# Patient Record
Sex: Female | Born: 1995 | Race: Black or African American | Hispanic: No | Marital: Single | Smoking: Never smoker
Health system: Southern US, Community
[De-identification: ages and names within clinical notes are randomized; demographics above are authoritative.]

---

## 2017-10-05 ENCOUNTER — Encounter (HOSPITAL_COMMUNITY): Payer: Self-pay | Admitting: Emergency Medicine

## 2017-10-05 ENCOUNTER — Other Ambulatory Visit: Payer: Self-pay

## 2017-10-05 ENCOUNTER — Emergency Department (HOSPITAL_COMMUNITY)
Admission: EM | Admit: 2017-10-05 | Discharge: 2017-10-06 | Disposition: A | Discharge status: Home / Self Care | Payer: BLUE CROSS/BLUE SHIELD | Attending: Emergency Medicine | Admitting: Emergency Medicine

## 2017-10-05 DIAGNOSIS — R51 Headache: Secondary | ICD-10-CM | POA: Diagnosis present

## 2017-10-05 DIAGNOSIS — J0110 Acute frontal sinusitis, unspecified: Secondary | ICD-10-CM | POA: Insufficient documentation

## 2017-10-05 DIAGNOSIS — R519 Headache, unspecified: Secondary | ICD-10-CM

## 2017-10-05 MED ORDER — KETOROLAC TROMETHAMINE 30 MG/ML IJ SOLN
30.0000 mg | Freq: Once | INTRAMUSCULAR | Status: AC
  Administered 2017-10-05: 30 mg via INTRAVENOUS
  Filled 2017-10-05: qty 1

## 2017-10-05 MED ORDER — METOCLOPRAMIDE HCL 5 MG/ML IJ SOLN
10.0000 mg | Freq: Once | INTRAMUSCULAR | Status: AC
Start: 2017-10-05 — End: 2017-10-05
  Administered 2017-10-05: 10 mg via INTRAVENOUS
  Filled 2017-10-05: qty 2

## 2017-10-05 MED ORDER — SODIUM CHLORIDE 0.9 % IV BOLUS
1000.0000 mL | Freq: Once | INTRAVENOUS | Status: AC
  Administered 2017-10-05: 1000 mL via INTRAVENOUS

## 2017-10-05 MED ORDER — DIPHENHYDRAMINE HCL 50 MG/ML IJ SOLN
25.0000 mg | Freq: Once | INTRAMUSCULAR | Status: AC
  Administered 2017-10-05: 25 mg via INTRAVENOUS
  Filled 2017-10-05: qty 1

## 2017-10-05 NOTE — ED Provider Notes (Signed)
[facility]-EMERGENCY DEPT Provider Note   CSN: [id number] Arrival date & time: 10/05/17  2139     History   Chief Complaint Chief Complaint  Patient presents with  . Headache    HPI Amy Bright is a 22 y.o. female.  HPI   22 year old female presenting for evaluation of headache.  Patient report for the past 2 weeks she has had cold symptoms with history includes throbbing frontal headache, sinus congestion, sneezing, coughing, throat irritation, and chest congestion.  Her symptom has been persistent not improved despite using over-the-counter medication such as Sudafed and nasal spray.  She tries ibuprofen on occasion with minimal improvement.  Headache is becoming progressively worse, she left work today to come here for further management.  Headache is throbbing, achy, moderate in severity with light and sensitivity.  Denies fever, chills, neck stiffness, focal numbness or weakness, or rash.   History reviewed. No pertinent past medical history.  There are no active problems to display for this patient.   History reviewed. No pertinent surgical history.   OB History   None      Home Medications    Prior to Admission medications   Not on File    Family History No family history on file.  Social History Social History   Tobacco Use  . Smoking status: Not on file  Substance Use Topics  . Alcohol use: Not Currently    Frequency: Never  . Drug use: Not Currently     Allergies   Patient has no known allergies.   Review of Systems Review of Systems  All other systems reviewed and are negative.    Physical Exam Updated Vital Signs BP (!) 145/85 (BP Location: Right Arm)   Pulse (!) 101   Temp 98.4 F (36.9 C) (Oral)   Ht 5' 5.5" (1.664 m)   Wt (!) 138.3 kg (305 lb)   SpO2 100%   BMI 49.98 kg/m   Physical Exam  Constitutional: She is oriented to person, place, and time. She appears well-developed and well-nourished. No  distress.  Obese female nontoxic in appearance  HENT:  Head: Atraumatic.  Mouth/Throat: Oropharynx is clear and moist.  Ears: TMs normal bilaterally Nose: Normal nares Throat: Uvula midline no tonsillar enlargement or exudate Tenderness to percussion's of frontal and ethmoids sinuses  Eyes: Pupils are equal, round, and reactive to light. Conjunctivae and EOM are normal.  Neck: Normal range of motion. Neck supple.  No nuchal rigidity  Cardiovascular: Normal rate and regular rhythm.  Pulmonary/Chest: Effort normal and breath sounds normal.  Abdominal: Soft. Bowel sounds are normal. There is no tenderness.  Musculoskeletal: Normal range of motion.  Neurological: She is alert and oriented to person, place, and time. She has normal strength. GCS eye subscore is 4. GCS verbal subscore is 5. GCS motor subscore is 6.  Skin: Skin is warm. No rash noted.  Psychiatric: She has a normal mood and affect.  Nursing note and vitals reviewed.    ED Treatments / Results  Labs (all labs ordered are listed, but only abnormal results are displayed) Labs Reviewed  I-STAT BETA HCG BLOOD, ED ([facility], [facility], [facility] ONLY)    EKG None  Radiology No results found.  Procedures Procedures (including critical care time)  Medications Ordered in ED Medications  sodium chloride 0.9 % bolus 1,000 mL (0 mLs Intravenous Stopped 10/06/17 0225)  diphenhydrAMINE (BENADRYL) injection 25 mg (25 mg Intravenous Given 10/05/17 2352)  metoCLOPramide (REGLAN) injection 10 mg (10 mg  Intravenous Given 10/05/17 2352)  ketorolac (TORADOL) 30 MG/ML injection 30 mg (30 mg Intravenous Given 10/05/17 2352)     Initial Impression / Assessment and Plan / ED Course  I have reviewed the triage vital signs and the nursing notes.  Pertinent labs & imaging results that were available during my care of the patient were reviewed by me and considered in my medical decision making (see chart for details).     BP (!) 107/53 (BP Location: Right  Arm)   Pulse 82   Temp 98.4 F (36.9 C) (Oral)   Resp 15   Ht 5' 5.5" (1.664 m)   Wt (!) 138.3 kg (305 lb)   SpO2 100%   BMI 49.98 kg/m    Final Clinical Impressions(s) / ED Diagnoses   Final diagnoses:  Sinus headache  Subacute frontal sinusitis    ED Discharge Orders        Ordered    amoxicillin-clavulanate (AUGMENTIN) 875-125 MG tablet  2 times daily     10/06/17 0234     11:32 PM Patient complaining of sinus headache along with cold symptoms.  She is well-appearing no nuchal rigidity no evidence to suggest meningitis.  Headache has been an ongoing issue for approximately 2 weeks.  Will give migraine cocktail.  Anticipate discharge with Augmentin as treatment for suspect sinusitis due to the prolonged course of her symptoms.  2:30 AM Patient resting comfortably after receiving migraine cocktail.  She is stable for discharge.  Patient discharged home with Augmentin for treatment of suspected sinusitis.   Fayrene Helper, PA-C 10/06/17 0235    Nicanor Alcon, April, MD 10/06/17 [phone]

## 2017-10-05 NOTE — ED Triage Notes (Signed)
Pt from home with c/o sinus pressure and pain that began when she had a cold on Saturday. Pt states pain is in frontal lobe and around eyes and sinus area. Pt is afebrile. Pt denies changes in vision and no neuro deficits are noted.

## 2017-10-06 ENCOUNTER — Other Ambulatory Visit: Payer: Self-pay

## 2017-10-06 LAB — I-STAT BETA HCG BLOOD, ED (MC, WL, AP ONLY)

## 2017-10-06 MED ORDER — AMOXICILLIN-POT CLAVULANATE 875-125 MG PO TABS: 1.0000 | ORAL_TABLET | Freq: Two times a day (BID) | ORAL | 0 refills | Status: DC

## 2018-06-12 ENCOUNTER — Ambulatory Visit (HOSPITAL_COMMUNITY)
Admission: EM | Admit: 2018-06-12 | Discharge: 2018-06-12 | Disposition: A | Discharge status: Home / Self Care | Payer: BLUE CROSS/BLUE SHIELD | Attending: Internal Medicine | Admitting: Internal Medicine

## 2018-06-12 ENCOUNTER — Other Ambulatory Visit: Payer: Self-pay

## 2018-06-12 ENCOUNTER — Encounter (HOSPITAL_COMMUNITY): Payer: Self-pay | Admitting: Emergency Medicine

## 2018-06-12 DIAGNOSIS — E079 Disorder of thyroid, unspecified: Secondary | ICD-10-CM | POA: Diagnosis present

## 2018-06-12 LAB — T4, FREE: Free T4: 1.01 ng/dL (ref 0.82–1.77)

## 2018-06-12 LAB — TSH: TSH: 1.4 u[IU]/mL (ref 0.350–4.500)

## 2018-06-12 MED ORDER — FLUTICASONE PROPIONATE 50 MCG/ACT NA SUSP: 1.0000 | Freq: Every day | NASAL | 0 refills | Status: AC

## 2018-06-12 NOTE — Discharge Instructions (Addendum)
Patient is scheduled for thyroid ultrasound tomorrow at [facility] radiology department at 4:30 PM.

## 2018-06-12 NOTE — ED Provider Notes (Signed)
[facility]    CSN: [id number] Arrival date & time: 06/12/18  1612     History   Chief Complaint Chief Complaint  Patient presents with  . Sore Throat    HPI Amy Bright is a 23 y.o. female comes to the urgent care department for pain on swallowing.   Patient was apparently well until about 2 weeks ago when she had some nasal congestion and sore throat.  She treated her symptomatically and that improved.  She comes in today with a few day history of difficulty swallowing.  Difficulty swallowing is for both solids and liquids.  No fever or chills.  No trauma to the neck.  No shortness of breath or wheezing.  Patient complains of previous history of thyroid enlargement with tenderness.   History reviewed. No pertinent past medical history.  There are no active problems to display for this patient.   History reviewed. No pertinent surgical history.  OB History   No obstetric history on file.      Home Medications    Prior to Admission medications   Medication Sig Start Date End Date Taking? Authorizing Provider  amoxicillin-clavulanate (AUGMENTIN) 875-125 MG tablet Take 1 tablet by mouth 2 (two) times daily. One po bid x 7 days 10/06/17   Fayrene Helper, PA-C    Family History Family History  Problem Relation Age of Onset  . Hypertension Mother     Social History Social History   Tobacco Use  . Smoking status: Never Smoker  Substance Use Topics  . Alcohol use: Yes    Frequency: Never  . Drug use: Not Currently    Types: Marijuana     Allergies   Patient has no known allergies.   Review of Systems Review of Systems  Constitutional: Negative for activity change, appetite change, chills, fatigue and fever.  HENT: Positive for postnasal drip. Negative for drooling, ear discharge, ear pain, facial swelling, rhinorrhea, sinus pressure, sinus pain and sore throat.   Eyes: Negative for redness.  Respiratory: Negative for cough, chest tightness and  shortness of breath.   Gastrointestinal: Negative for abdominal distention, abdominal pain, nausea and vomiting.  Endocrine: Negative for heat intolerance, polydipsia, polyphagia and polyuria.  Genitourinary: Negative for flank pain and menstrual problem.  Musculoskeletal: Negative for arthralgias and myalgias.  Skin: Negative for rash and wound.  Neurological: Negative for dizziness, weakness and numbness.  Psychiatric/Behavioral: Negative for confusion and decreased concentration.     Physical Exam Triage Vital Signs ED Triage Vitals  Enc Vitals Group     BP 06/12/18 1705 (!) 118/50     Pulse Rate 06/12/18 1705 61     Resp 06/12/18 1705 18     Temp 06/12/18 1705 98.6 F (37 C)     Temp Source 06/12/18 1705 Temporal     SpO2 06/12/18 1705 100 %     Weight --      Height --      Head Circumference --      Peak Flow --      Pain Score 06/12/18 1701 4     Pain Loc --      Pain Edu? --      Excl. in GC? --    No data found.  Updated Vital Signs BP (!) 118/50 (BP Location: Left Arm) Comment (BP Location): large cuff  Pulse 61   Temp 98.6 F (37 C) (Temporal)   Resp 18   SpO2 100%   Visual Acuity Right Eye Distance:  Left Eye Distance:   Bilateral Distance:    Right Eye Near:   Left Eye Near:    Bilateral Near:     Physical Exam Vitals signs and nursing note reviewed.  Constitutional:      General: She is not in acute distress.    Appearance: She is well-developed. She is not ill-appearing.  HENT:     Right Ear: Tympanic membrane normal.     Left Ear: Tympanic membrane normal.     Nose: No congestion or rhinorrhea.     Mouth/Throat:     Mouth: Mucous membranes are moist. No oral lesions.     Pharynx: No oropharyngeal exudate.     Tonsils: Swelling: 1+ on the right. 1+ on the left.  Eyes:     Pupils: Pupils are equal, round, and reactive to light.  Neck:     Thyroid: Thyromegaly present.     Comments: Thyromegaly with tenderness on  palpation Cardiovascular:     Rate and Rhythm: Normal rate and regular rhythm.     Heart sounds: Normal heart sounds.  Pulmonary:     Effort: Pulmonary effort is normal.     Breath sounds: Normal breath sounds.  Abdominal:     General: Bowel sounds are normal.     Palpations: Abdomen is soft.  Lymphadenopathy:     Cervical: No cervical adenopathy.  Skin:    Capillary Refill: Capillary refill takes less than 2 seconds.  Neurological:     General: No focal deficit present.     Mental Status: She is alert.      UC Treatments / Results  Labs (all labs ordered are listed, but only abnormal results are displayed) Labs Reviewed  TSH  T4, FREE    EKG None  Radiology No results found.  Procedures Procedures (including critical care time)  Medications Ordered in UC Medications - No data to display  Initial Impression / Assessment and Plan / UC Course  I have reviewed the triage vital signs and the nursing notes.  Pertinent labs & imaging results that were available during my care of the patient were reviewed by me and considered in my medical decision making (see chart for details).     1.  Thyromegaly with tenderness (thyroiditis possibly viral): Ultrasound of the thyroid gland Free T4 TSH If labs are abnormal patient may need further work-up for Hashimoto's disease  Final Clinical Impressions(s) / UC Diagnoses   Final diagnoses:  Swelling of thyroid gland   Discharge Instructions   None    ED Prescriptions    None     Controlled Substance Prescriptions [place] Controlled Substance Registry consulted? No   Merrilee Jansky, MD 06/12/18 [phone]

## 2018-06-12 NOTE — ED Triage Notes (Signed)
Sore throat started 2 weeks ago.  Patient has congestion in throat.

## 2018-06-13 ENCOUNTER — Ambulatory Visit (HOSPITAL_COMMUNITY): Admission: RE | Admit: 2018-06-13 | Payer: BLUE CROSS/BLUE SHIELD | Source: Ambulatory Visit

## 2018-06-13 ENCOUNTER — Ambulatory Visit (HOSPITAL_COMMUNITY)
Admit: 2018-06-13 | Discharge: 2018-06-13 | Disposition: A | Discharge status: Home / Self Care | Payer: BLUE CROSS/BLUE SHIELD | Attending: Internal Medicine | Admitting: Internal Medicine

## 2018-06-13 ENCOUNTER — Other Ambulatory Visit (HOSPITAL_COMMUNITY): Payer: Self-pay | Admitting: Internal Medicine

## 2018-06-13 ENCOUNTER — Ambulatory Visit (HOSPITAL_COMMUNITY)
Admission: RE | Admit: 2018-06-13 | Discharge: 2018-06-13 | Disposition: A | Discharge status: Home / Self Care | Payer: BLUE CROSS/BLUE SHIELD | Source: Ambulatory Visit | Attending: Internal Medicine | Admitting: Internal Medicine

## 2018-06-13 DIAGNOSIS — E049 Nontoxic goiter, unspecified: Secondary | ICD-10-CM | POA: Diagnosis not present

## 2018-06-15 ENCOUNTER — Telehealth (HOSPITAL_COMMUNITY): Payer: Self-pay | Admitting: Internal Medicine

## 2018-06-15 NOTE — Telephone Encounter (Signed)
I called the patient and discussed the results of TSH, Free T4 with the patient. US thyroid which was remarkable for multinodular goiter was discussed. Call was made to Dr.Stephen South's office and message was left with referral coordinator. Patient is also advised to call the Endocrinologist office for an appointment. Patient was furnished with Endocrinologist details and also phone number.

## 2019-02-26 ENCOUNTER — Ambulatory Visit (HOSPITAL_COMMUNITY)
Admission: EM | Admit: 2019-02-26 | Discharge: 2019-02-26 | Disposition: A | Discharge status: Home / Self Care | Payer: Self-pay | Attending: Family Medicine | Admitting: Family Medicine

## 2019-02-26 ENCOUNTER — Other Ambulatory Visit: Payer: Self-pay

## 2019-02-26 DIAGNOSIS — Z202 Contact with and (suspected) exposure to infections with a predominantly sexual mode of transmission: Secondary | ICD-10-CM | POA: Insufficient documentation

## 2019-02-26 DIAGNOSIS — Z3202 Encounter for pregnancy test, result negative: Secondary | ICD-10-CM

## 2019-02-26 DIAGNOSIS — Z113 Encounter for screening for infections with a predominantly sexual mode of transmission: Secondary | ICD-10-CM

## 2019-02-26 LAB — POC URINE PREG, ED: Preg Test, Ur: NEGATIVE

## 2019-02-26 LAB — POCT PREGNANCY, URINE: Preg Test, Ur: NEGATIVE

## 2019-02-26 MED ORDER — CEFTRIAXONE SODIUM 250 MG IJ SOLR
INTRAMUSCULAR | Status: AC
  Filled 2019-02-26: qty 250

## 2019-02-26 MED ORDER — AZITHROMYCIN 250 MG PO TABS
ORAL_TABLET | ORAL | Status: AC
  Filled 2019-02-26: qty 4

## 2019-02-26 MED ORDER — CEFTRIAXONE SODIUM 250 MG IJ SOLR
250.0000 mg | Freq: Once | INTRAMUSCULAR | Status: AC
  Administered 2019-02-26: 250 mg via INTRAMUSCULAR

## 2019-02-26 MED ORDER — AZITHROMYCIN 250 MG PO TABS
1000.0000 mg | ORAL_TABLET | Freq: Once | ORAL | Status: AC
  Administered 2019-02-26: 12:00:00 1000 mg via ORAL

## 2019-02-26 NOTE — ED Triage Notes (Addendum)
Pt presents to UC stating she would like to be tested for gonorrhea and chlamydia bc one of her sexual partners tested positive. Pt denies any symptoms at this time.   Pt states she has been "on her period for 4 weeks" and would like a pregnancy test.

## 2019-02-26 NOTE — Discharge Instructions (Signed)
We have treated you today for gonorrhea and chlamydia, with rocephin and azithromycin. Please refrain from sexual intercourse for 7 days while medicines eliminating infection.   We are testing you for Gonorrhea, Chlamydia, Trichomonas, Yeast and Bacterial Vaginosis. We will call you if anything is positive and let you know if you require any further treatment. Please inform partners of any positive results.   Please return if symptoms not improving with treatment, development of fever, nausea, vomiting, abdominal pain. ' 

## 2019-02-26 NOTE — ED Provider Notes (Signed)
[facility]    CSN: [id number] Arrival date & time: 02/26/19  1056      History   Chief Complaint Chief Complaint  Patient presents with  . Exposure to STD    HPI Amy Bright is a 23 y.o. female no significant past medical history presenting today for STD screening after exposure.  Patient states that her partner recently tested positive for gonorrhea and chlamydia.  Because of this patient to be screened.  STDs.  She uses Nexplanon for birth control.  She states that she typically does not have menstrual cycles with this, but over the past months she has been bleeding.  Flow will wax and wane.  She denies associated abdominal pain.  Denies abnormal discharge.  Denies nausea or vomiting.  HPI  No past medical history on file.  There are no active problems to display for this patient.   No past surgical history on file.  OB History   No obstetric history on file.      Home Medications    Prior to Admission medications   Medication Sig Start Date End Date Taking? Authorizing Provider  fluticasone (FLONASE) 50 MCG/ACT nasal spray Place 1 spray into both nostrils daily. 06/12/18   LampteyBritta Mccreedy, MD    Family History Family History  Problem Relation Age of Onset  . Hypertension Mother     Social History Social History   Tobacco Use  . Smoking status: Never Smoker  Substance Use Topics  . Alcohol use: Yes    Frequency: Never  . Drug use: Not Currently    Types: Marijuana     Allergies   Patient has no known allergies.   Review of Systems Review of Systems  Constitutional: Negative for fever.  Respiratory: Negative for shortness of breath.   Cardiovascular: Negative for chest pain.  Gastrointestinal: Negative for abdominal pain, diarrhea, nausea and vomiting.  Genitourinary: Positive for menstrual problem. Negative for dysuria, flank pain, genital sores, hematuria, vaginal bleeding, vaginal discharge and vaginal pain.  Musculoskeletal:  Negative for back pain.  Skin: Negative for rash.  Neurological: Negative for dizziness, light-headedness and headaches.     Physical Exam Triage Vital Signs ED Triage Vitals  Enc Vitals Group     BP 02/26/19 1139 105/69     Pulse Rate 02/26/19 1139 67     Resp 02/26/19 1139 16     Temp 02/26/19 1139 98.3 F (36.8 C)     Temp Source 02/26/19 1139 Oral     SpO2 02/26/19 1139 99 %     Weight --      Height --      Head Circumference --      Peak Flow --      Pain Score 02/26/19 1141 0     Pain Loc --      Pain Edu? --      Excl. in GC? --    No data found.  Updated Vital Signs BP 105/69 (BP Location: Left Arm)   Pulse 67   Temp 98.3 F (36.8 C) (Oral)   Resp 16   SpO2 99%   Visual Acuity Right Eye Distance:   Left Eye Distance:   Bilateral Distance:    Right Eye Near:   Left Eye Near:    Bilateral Near:     Physical Exam Vitals signs and nursing note reviewed.  Constitutional:      Appearance: She is well-developed.     Comments: No acute distress  HENT:  Head: Normocephalic and atraumatic.     Nose: Nose normal.  Eyes:     Conjunctiva/sclera: Conjunctivae normal.  Neck:     Musculoskeletal: Neck supple.  Cardiovascular:     Rate and Rhythm: Normal rate.  Pulmonary:     Effort: Pulmonary effort is normal. No respiratory distress.  Abdominal:     General: There is no distension.     Comments: Soft, nondistended, tenderness to palpation to the lower abdomen and suprapubic area, negative rebound, negative Rovsing, negative McBurney's  Genitourinary:    Comments: Deferred Musculoskeletal: Normal range of motion.  Skin:    General: Skin is warm and dry.  Neurological:     Mental Status: She is alert and oriented to person, place, and time.      UC Treatments / Results  Labs (all labs ordered are listed, but only abnormal results are displayed) Labs Reviewed  POCT PREGNANCY, URINE  POC URINE PREG, ED  CERVICOVAGINAL ANCILLARY ONLY    EKG    Radiology No results found.  Procedures Procedures (including critical care time)  Medications Ordered in UC Medications  cefTRIAXone (ROCEPHIN) injection 250 mg (250 mg Intramuscular Given 02/26/19 1220)  azithromycin (ZITHROMAX) tablet 1,000 mg (1,000 mg Oral Given 02/26/19 1220)  azithromycin (ZITHROMAX) 250 MG tablet (has no administration in time range)  cefTRIAXone (ROCEPHIN) 250 MG injection (has no administration in time range)    Initial Impression / Assessment and Plan / UC Course  I have reviewed the triage vital signs and the nursing notes.  Pertinent labs & imaging results that were available during my care of the patient were reviewed by me and considered in my medical decision making (see chart for details).     Given exposure will go ahead and empirically treat for gonorrhea and chlamydia today with Rocephin and azithromycin.  Vaginal swab obtained and will send off to check for STDs as well as yeast and BV.  Pregnancy test negative.  Abnormal bleeding may be related to possible STD, but advised to continue to monitor, if symptoms persisting to follow-up with OB/GYN for further evaluation.  Discussed strict return precautions. Patient verbalized understanding and is agreeable with plan.  Final Clinical Impressions(s) / UC Diagnoses   Final diagnoses:  STD exposure     Discharge Instructions     We have treated you today for gonorrhea and chlamydia, with rocephin and azithromycin. Please refrain from sexual intercourse for 7 days while medicines eliminating infection.   We are testing you for Gonorrhea, Chlamydia, Trichomonas, Yeast and Bacterial Vaginosis. We will call you if anything is positive and let you know if you require any further treatment. Please inform partners of any positive results.   Please return if symptoms not improving with treatment, development of fever, nausea, vomiting, abdominal pain.     ED Prescriptions    None     PDMP not  reviewed this encounter.   Janith Lima, [place] 02/26/19 1319

## 2019-02-28 ENCOUNTER — Telehealth (HOSPITAL_COMMUNITY): Payer: Self-pay | Admitting: Emergency Medicine

## 2019-02-28 LAB — CERVICOVAGINAL ANCILLARY ONLY
Bacterial vaginitis: POSITIVE — AB
Candida vaginitis: POSITIVE — AB
Chlamydia: NEGATIVE
Neisseria Gonorrhea: POSITIVE — AB
Trichomonas: NEGATIVE

## 2019-02-28 MED ORDER — METRONIDAZOLE 500 MG PO TABS: 500.0000 mg | ORAL_TABLET | Freq: Two times a day (BID) | ORAL | 0 refills | Status: AC

## 2019-02-28 MED ORDER — FLUCONAZOLE 150 MG PO TABS: 150.0000 mg | ORAL_TABLET | Freq: Once | ORAL | 0 refills | Status: AC

## 2019-02-28 NOTE — Telephone Encounter (Signed)
Test for gonorrhea was positive. This was treated at the urgent care visit with IM rocephin 250mg  and po zithromax 1g. Pt needs education to refrain from sexual intercourse for 7 days after treatment to give the medicine time to work. Sexual partners need to be notified and tested/treated. Condoms may reduce risk of reinfection. Recheck or followup with PCP for further evaluation if symptoms are not improving. [facility] notified.   Bacterial vaginosis is positive. This was not treated at the urgent care visit.  Flagyl 500 mg BID x 7 days #14 no refills sent to patients pharmacy of choice.    Test for candida (yeast) was positive.  Prescription for fluconazole 150mg  po now, repeat dose in 3d if needed, #2 no refills, sent to the pharmacy of record.  Recheck or followup with PCP for further evaluation if symptoms are not improving.    Patient contacted and made aware of    results. Pt verbalized understanding and had all questions answered.

## 2020-12-18 IMAGING — US US THYROID
1 series · 13 of 25 positions shown · non-contrast
Comparison: None.

CLINICAL DATA: Palpable abnormality. Enlarged thyroid on physical
examination.

EXAM:
THYROID ULTRASOUND
TECHNIQUE: Ultrasound examination of the thyroid gland and adjacent soft
tissues was performed.

[Series 1: us thyroid · 0.07mm/px · 13 of 54 slices shown]
[im 1/54]
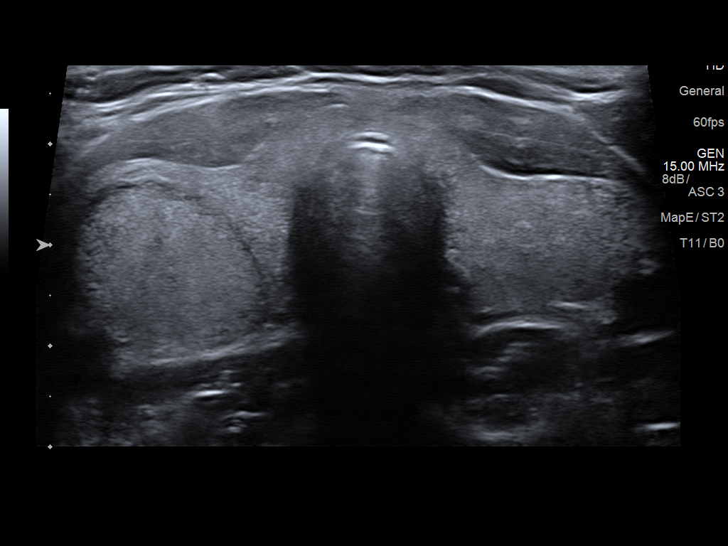
[im 5/54]
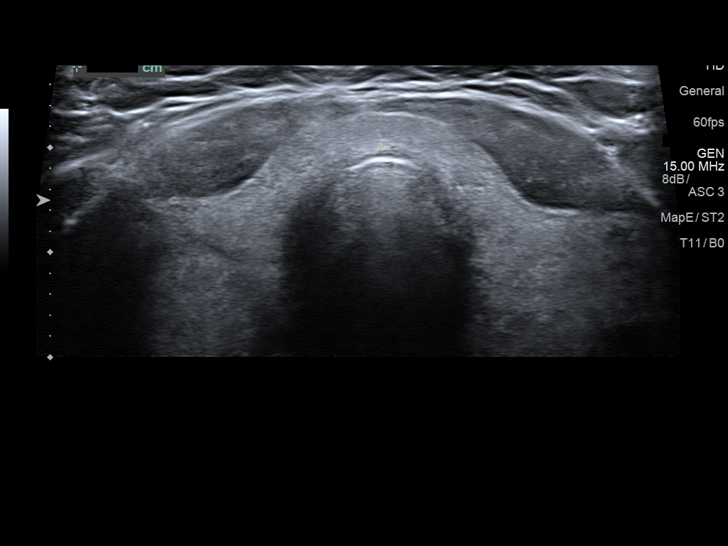
[im 9/54]
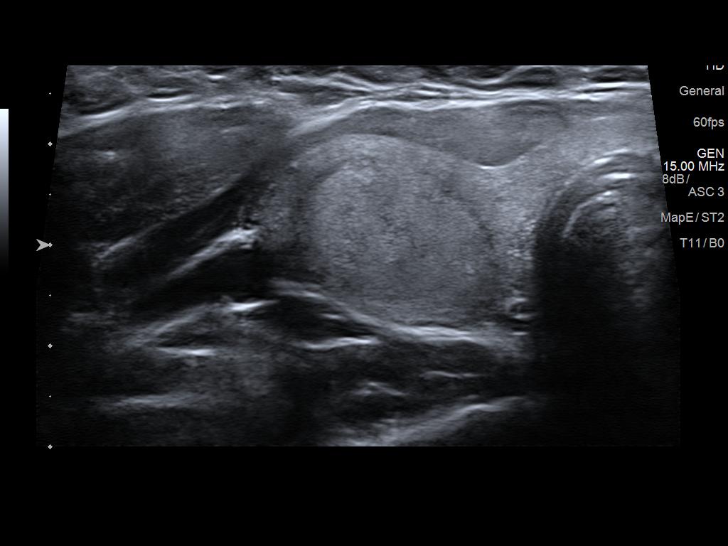
[im 14/54]
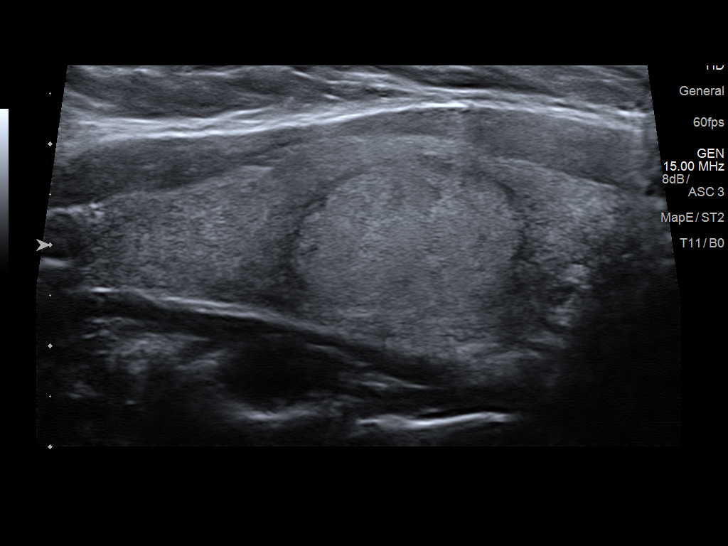
[im 18/54]
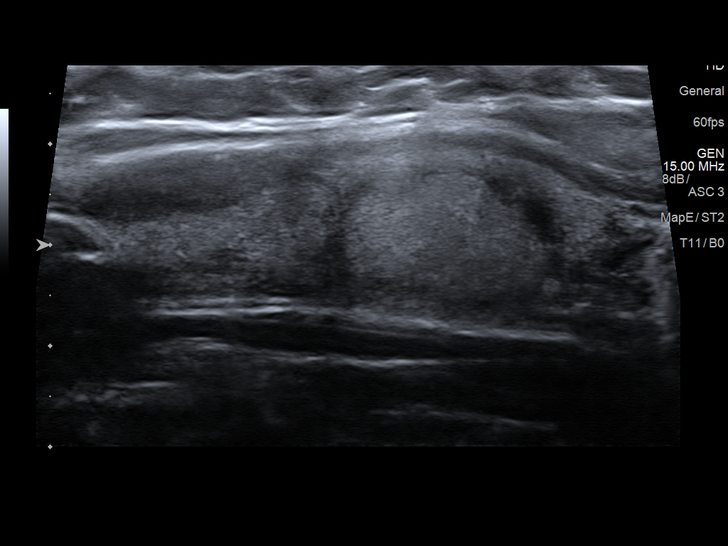
[im 23/54]
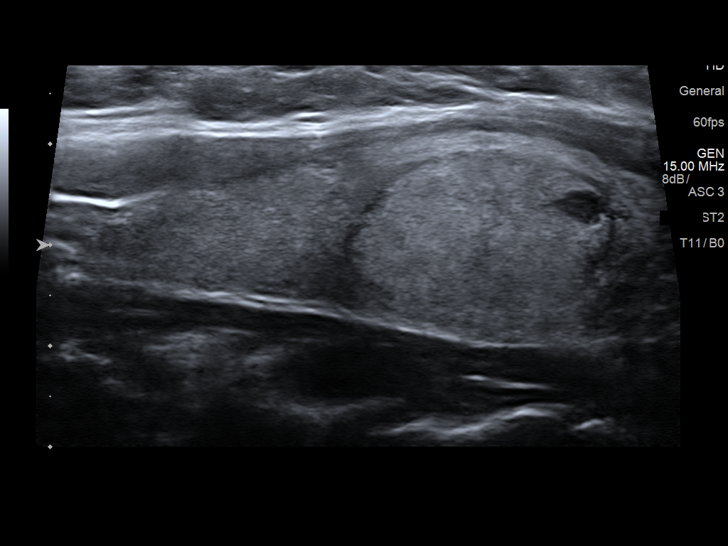
[im 27/54]
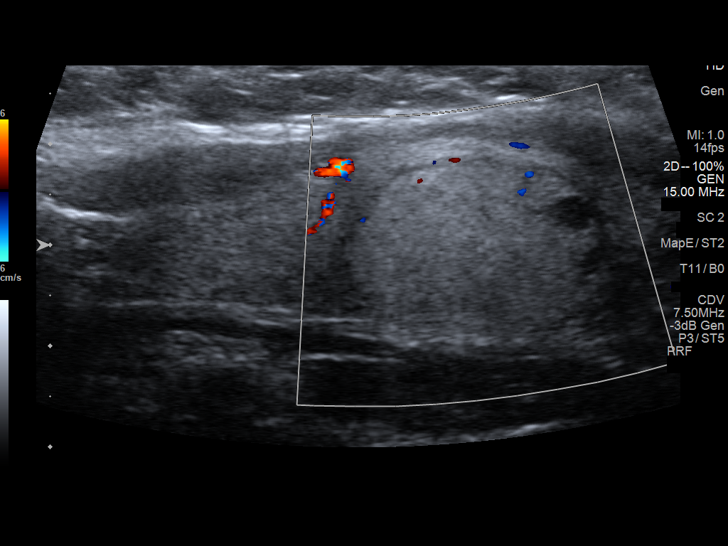
[im 31/54]
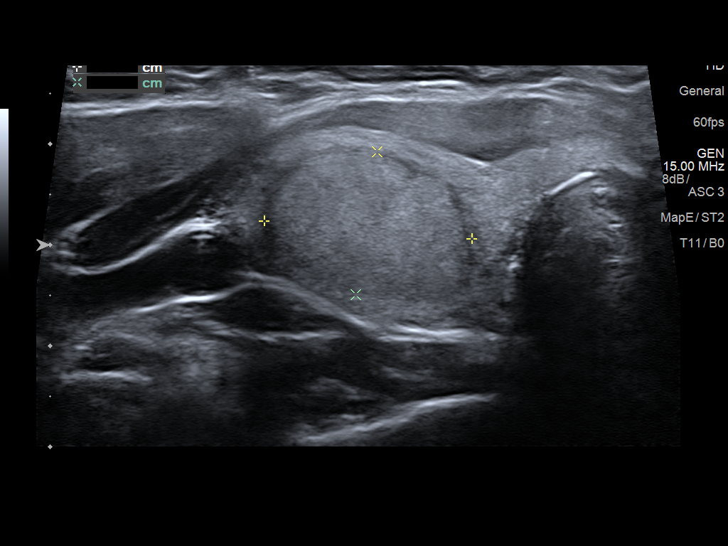
[im 36/54]
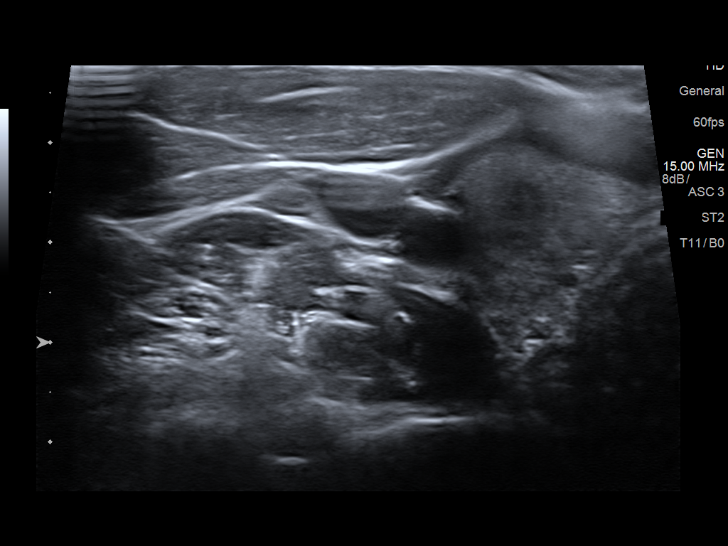
[im 40/54]
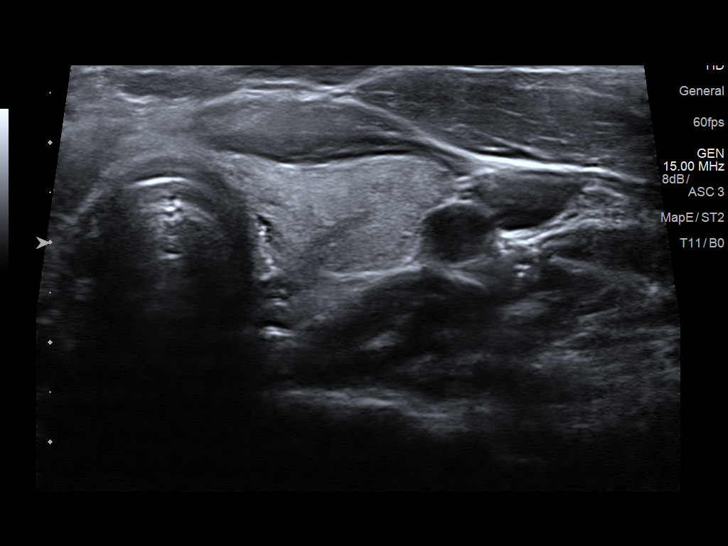
[im 45/54]
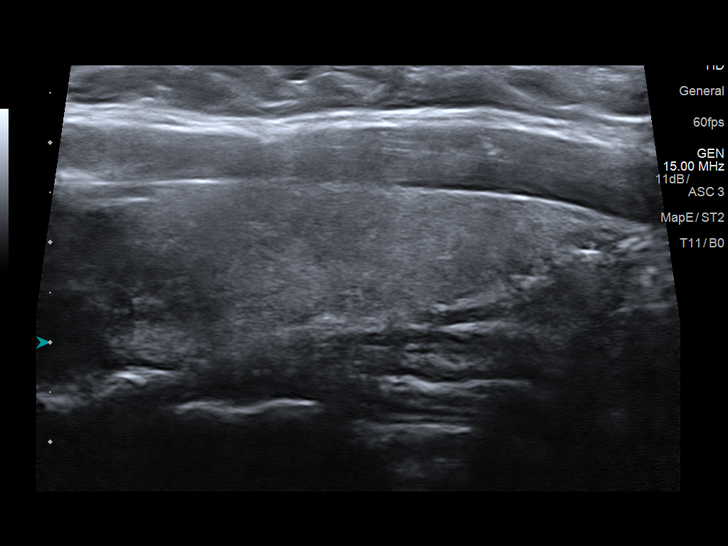
[im 49/54]
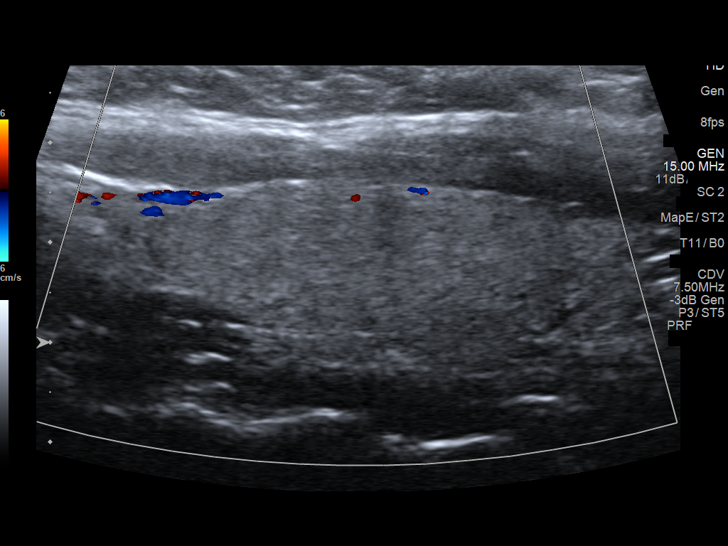
[im 54/54]
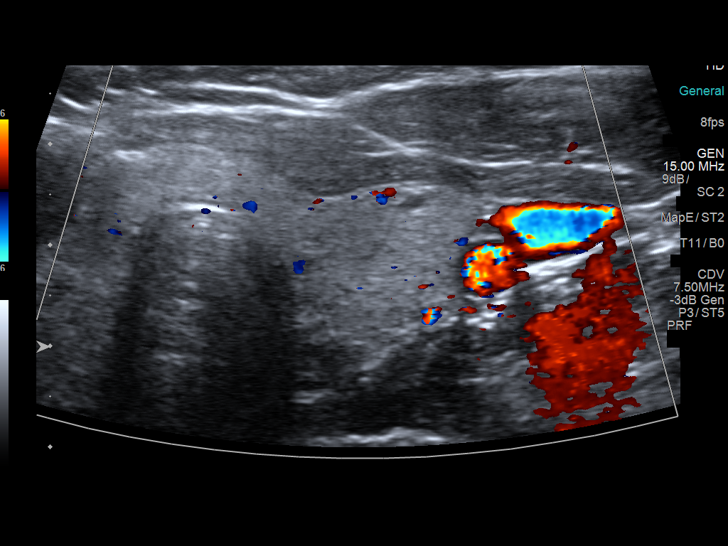

[13 of 25 positions shown; findings below may reference images not displayed]

FINDINGS: Parenchymal Echotexture: Mildly heterogenous

Isthmus: Normal in size measures 0.3 cm in diameter

Right lobe: Enlarged measuring 5.5 x 2.2 x 2.9 cm

Left lobe: Is enlarged measuring 5.7 x 1.7 x 2.1 cm

_________________________________________________________

Estimated total number of nodules >/= 1 cm: 1

Number of spongiform nodules >/=  2 cm not described below (TR1): 0

Number of mixed cystic and solid nodules >/= 1.5 cm not described
below (TR2): 0

_________________________________________________________

Nodule # 1:

Location: Right; Mid

Maximum size: 2.7 cm; Other 2 dimensions: 2.1 x 1.4 cm

Composition: solid/almost completely solid (2)

Echogenicity: isoechoic (1)

Shape: not taller-than-wide (0)

Margins: smooth (0)

Echogenic foci: none (0)

ACR TI-RADS total points: 3.

ACR TI-RADS risk category: TR3 (3 points).

ACR TI-RADS recommendations:

**Given size (>/= 2.5 cm) and appearance, fine needle aspiration of
this mildly suspicious nodule should be considered based on TI-RADS
criteria.

_________________________________________________________
IMPRESSION: Solitary 2.7 cm nodule within the right lobe of the thyroid meets
imaging criteria to recommend percutaneous sampling as clinically
indicated

The above is in keeping with the ACR TI-RADS recommendations - [HOSPITAL] 5742;[DATE].

## 2022-04-11 DIAGNOSIS — R112 Nausea with vomiting, unspecified: Secondary | ICD-10-CM | POA: Diagnosis not present

## 2022-04-11 DIAGNOSIS — K2970 Gastritis, unspecified, without bleeding: Secondary | ICD-10-CM | POA: Diagnosis not present

## 2022-04-11 DIAGNOSIS — R1110 Vomiting, unspecified: Secondary | ICD-10-CM | POA: Diagnosis not present

## 2022-04-11 DIAGNOSIS — R1013 Epigastric pain: Secondary | ICD-10-CM | POA: Diagnosis not present

## 2022-04-12 DIAGNOSIS — R1110 Vomiting, unspecified: Secondary | ICD-10-CM | POA: Diagnosis not present

## 2022-04-12 DIAGNOSIS — R1013 Epigastric pain: Secondary | ICD-10-CM | POA: Diagnosis not present

## 2022-04-12 DIAGNOSIS — K2970 Gastritis, unspecified, without bleeding: Secondary | ICD-10-CM | POA: Diagnosis not present

## 2022-04-23 ENCOUNTER — Telehealth: Payer: Self-pay

## 2022-04-23 NOTE — Telephone Encounter (Signed)
Mychart msg sent. AS, CMA
